# Patient Record
Sex: Male | Born: 1990 | Race: White | Hispanic: No | Marital: Single | Smoking: Current every day smoker
Health system: Southern US, Community
[De-identification: ages and names within clinical notes are randomized; demographics above are authoritative.]

## PROBLEM LIST (undated history)

## (undated) DIAGNOSIS — R569 Unspecified convulsions: Secondary | ICD-10-CM

## (undated) HISTORY — DX: Unspecified convulsions: R56.9

---
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
## 2009-11-16 IMAGING — CR DG CHEST 1V PORT
1 series · 1 of 1 positions shown · non-contrast
Comparison: 05/29/2007

CLINICAL DATA: Status epilepticus

PORTABLE CHEST - 1 VIEW

[view not recorded]
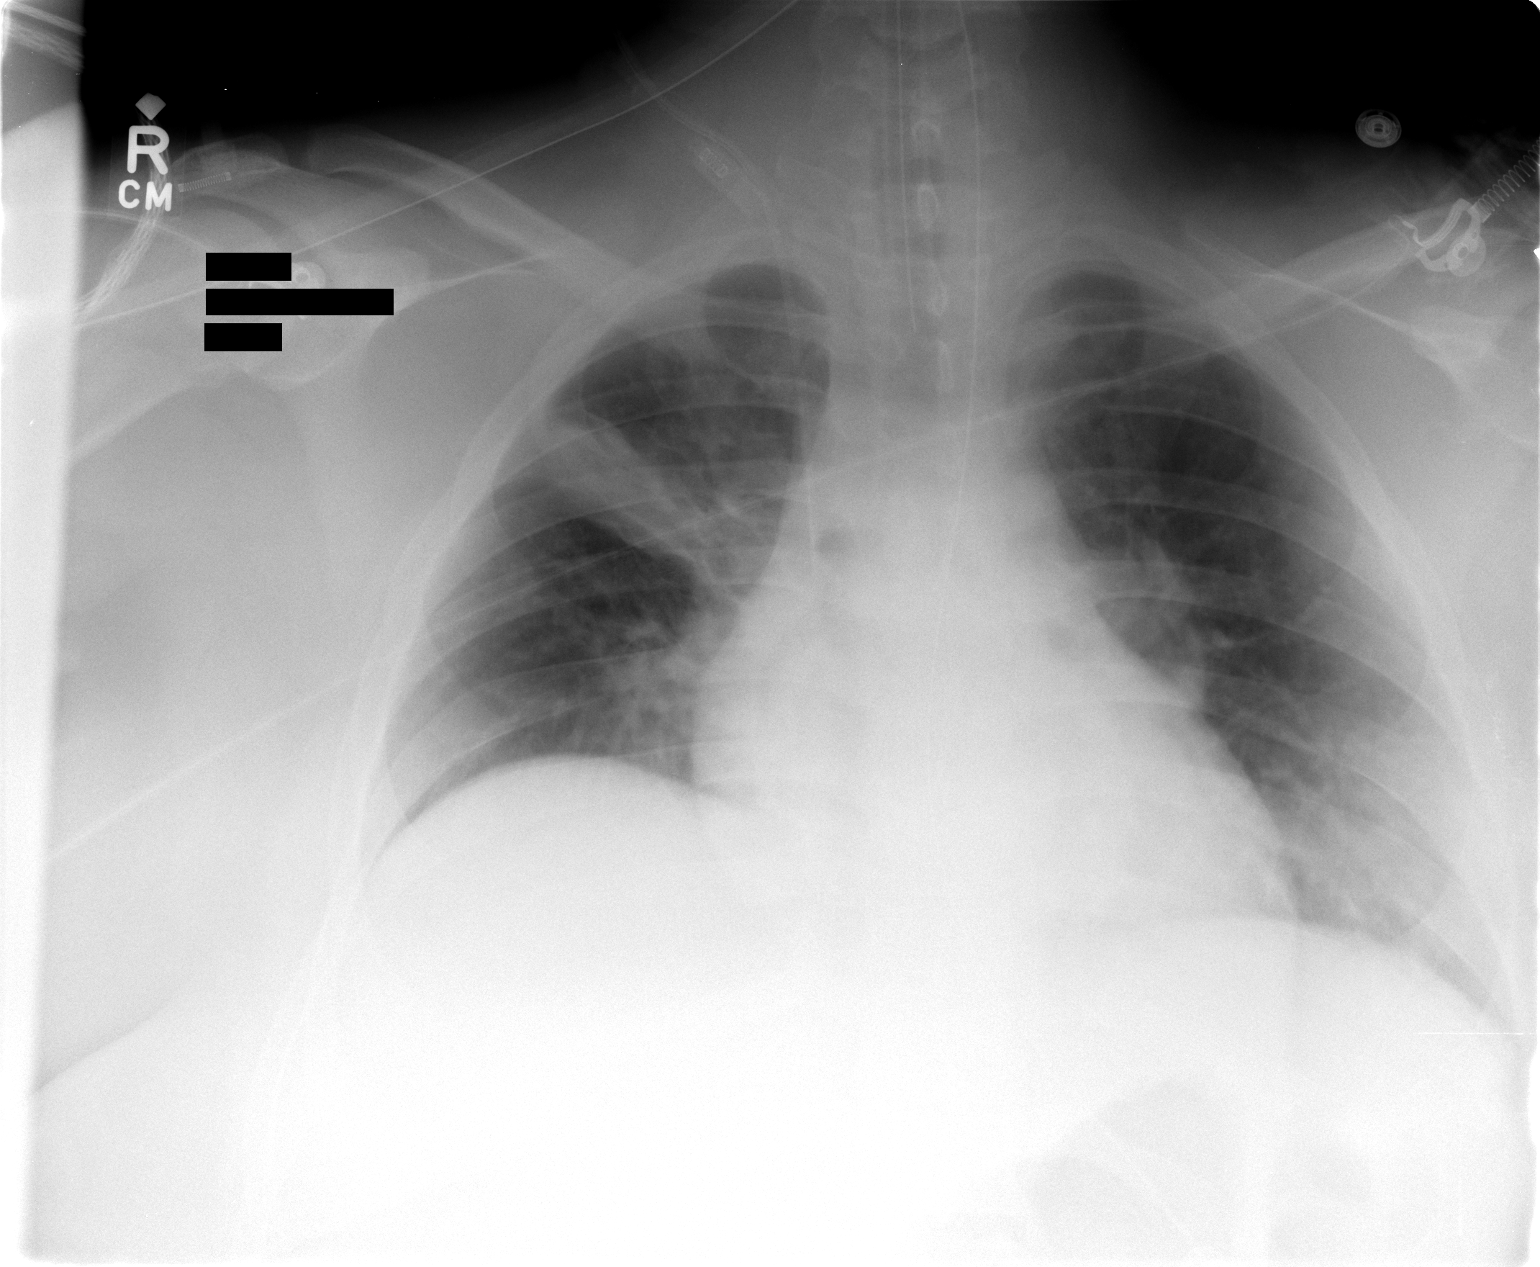

[1 of 1 positions shown; findings below may reference images not displayed]

FINDINGS: Right lower lobe atelectasis improved.  There is
increased right upper lobe atelectasis.  Heart and mediastinal
contours remain normal.  Support apparatus unchanged.
IMPRESSION: 1.  Right lower lobe airspace disease  improved.
2.  Increased right upper lobe atelectasis.

## 2009-11-17 IMAGING — CR DG CHEST 1V PORT
1 series · 1 of 1 positions shown · non-contrast
Comparison: Chest radiograph 05/30/2007

CLINICAL DATA: Status epilepticus

PORTABLE CHEST - 1 VIEW

[AP]
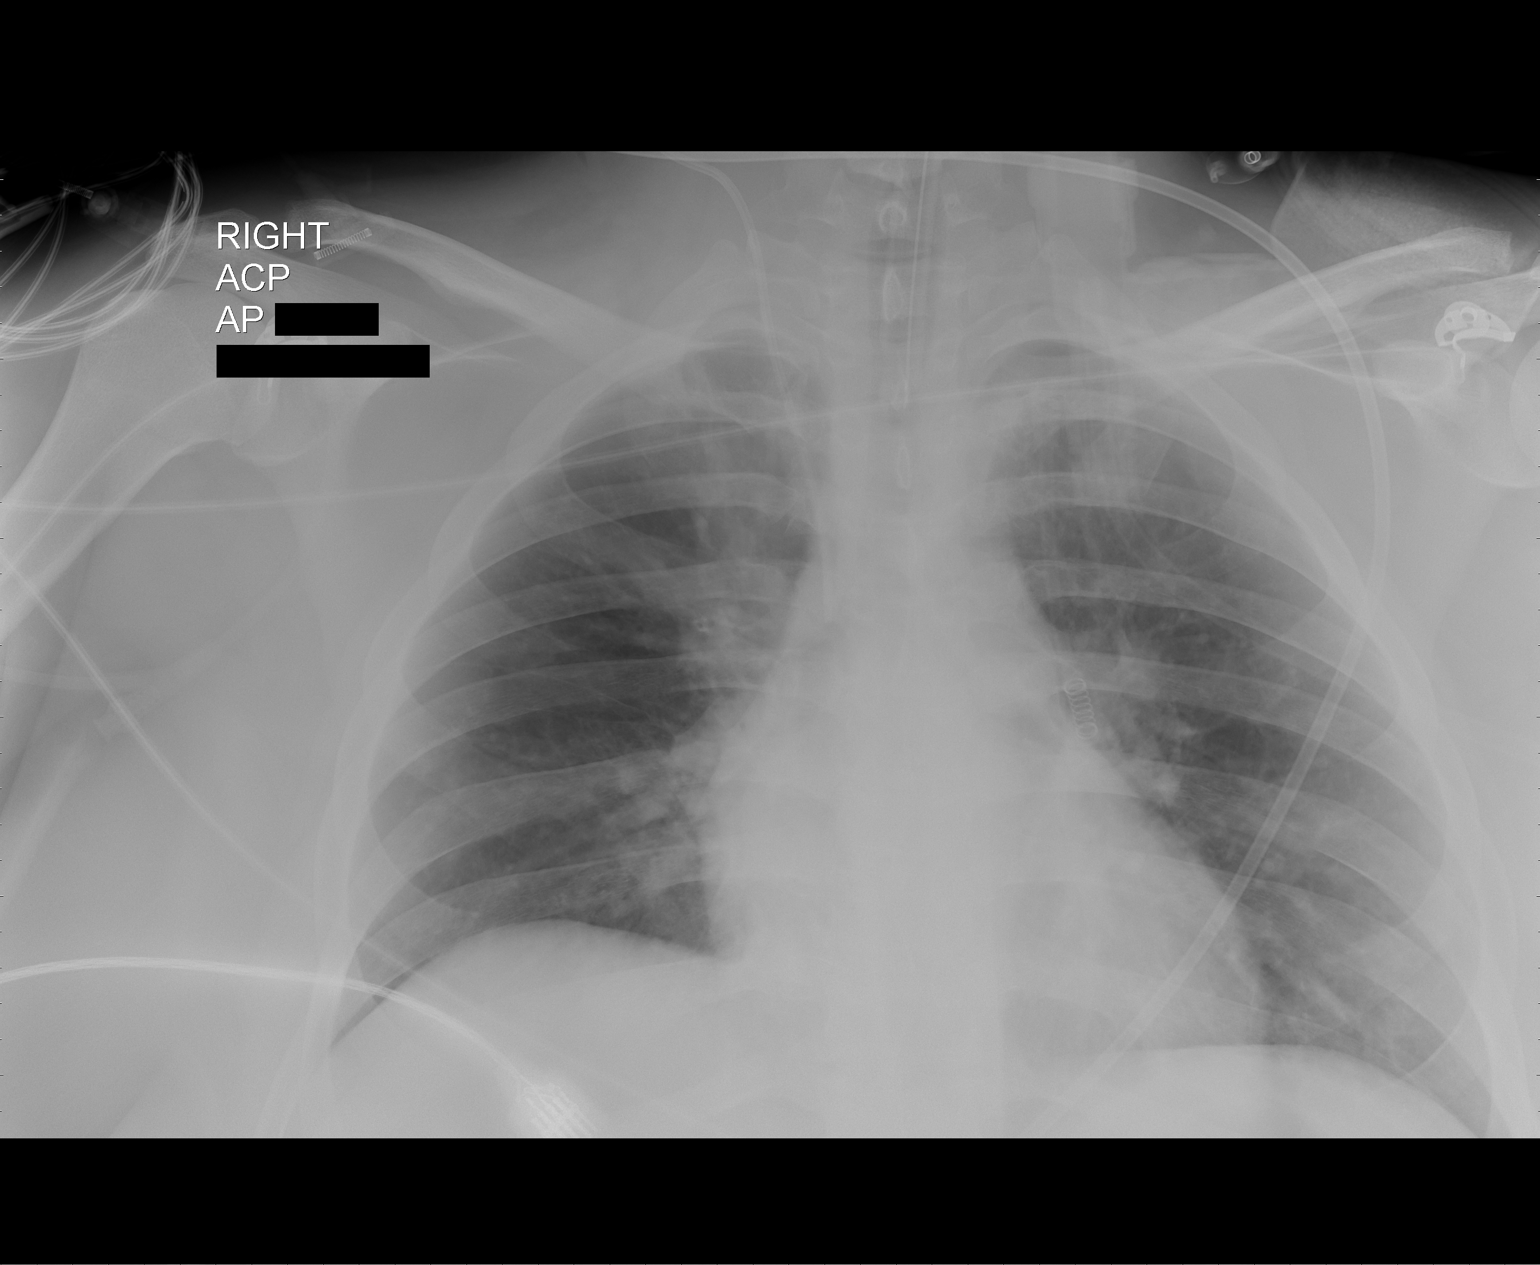

[1 of 1 positions shown; findings below may reference images not displayed]

FINDINGS: Endotracheal tube and right IJ catheter unchanged.
Stable cardiac silhouette.  Costophrenic angles are clear.  There
is a perihilar congestion versus mild edema.  No pneumothorax.
IMPRESSION: 1.  No significant interval change.
2.  Mild venous congestion versus central mild edema.

## 2010-07-05 NOTE — Procedures (Signed)
**Note De-Identified Markus Obfuscation** EEG NUMBER:  [id number]   HISTORY:  This is an 20 year old status post closed head injury with  seizures, who is having an EEG done to evaluate for seizure.   PROCEDURE:  This is a portal EEG.   TECHNICAL DESCRIPTION:  Throughout this portal EEG, there is no distinct  or sustained posterior down rhythm noted.  The background activity is  symmetric, mostly as part of a mixed frequency of alpha, theta, and  delta range activity at 20-50 mV.  There is superimposed beta activity  noticed, which is most likely from medication effect.  There is  occasional EMG movement artifact noticed throughout the background.  The  patient does appear drowsy and appears to fall asleep during this  recording with the appearance of symmetric sleep spindles.  Hyperventilation nor photic stimulation were performed throughout this  record.  Throughout this record, there is no definitive epileptiform  activity noticed.   The EKG tracing shows a heart rate of 100-120 beats per minute.   IMPRESSION:  This electroencephalogram is abnormal secondary to diffuse  slowing.  The diffuse slowing that is suggestive of a toxic, metabolic,  or primary neuronal disorder.  The presence of beta in the background is  highly suggestive of medication effect.  Clinical correlation is  advised.      Bevelyn Buckles. Nash Shearer, M.D.  Electronically Signed     [id number]:[id number]  D:  05/29/2007 17:50:09  T:  05/29/2007 19:36:03  Job #:  [id number]

## 2010-07-05 NOTE — Consult Note (Signed)
**Note De-Identified Burkhammer Obfuscation** NAME:  Speece, Armend                    ACCOUNT NO.:  [id number]   MEDICAL RECORD NO.:  [id number]          PATIENT TYPE:  INP   LOCATION:  2112                         FACILITY:  [facility]   PHYSICIAN:  Deanna Artis. Hickling, M.D.DATE OF BIRTH:  11-May-1990   DATE OF CONSULTATION:  05/24/2007  DATE OF DISCHARGE:                                 CONSULTATION   CHIEF COMPLAINT:  Recurrent seizures following closed head injury.   HISTORY OF PRESENT ILLNESS:  An 20 year old right-handed Caucasian male,  who had dizziness while playing basketball.  He continued to play and  then lost consciousness, falling face first onto the ball and onto a  gravel playing court, with abrasions to his face.  He was unresponsive.  EMS was called.  He is transported to [facility] and  had 8 generalized tonic-clonic seizures en route that were  treated with  Ativan.  He is agitated, not able to respond, moving all fours.  He was  treated with some Versed which calmed him.  He was then sent to CT scan,  returned having vomiting and possibly aspirated.  He felt respiratory  distress and needed intubation to protect his airway.   After intubation, the patient suffered another generalized tonic-clonic  seizure.  He was loaded with 1000 mg of Dilantin intravenously and  placed on Pavulon and Versed, to prevent him from biting through his ET  tube.   PAST MEDICAL HISTORY:  1. Single seizure at age 20 or 87.  2. He has had episodes of syncope and a presumed vasovagal due to      heat, lack of eating, seeing blood or injury.  3. Obesity.   PAST SURGICAL HISTORY:  None.   REVIEW OF SYSTEMS:  A 12-system review is negative.   FAMILY HISTORY:  No seizures, syncope, sudden death, mental retardation,  cerebral palsy, blindness, deafness or autism.   SOCIAL HISTORY:  The patient does not use tobacco, alcohol or drugs.   EXAMINATION:  VITAL SIGNS:  Temperature 98.2, blood pressure 118/53,  resting pulse 97,  respirations 28, oxygen saturation 100% on room air.  HEENT:  Ear, nose and throat:  No infections, no bruits.  LUNGS:  Show rhonchi and coarse airway sounds.  Possibly rales.  HEART:  No murmurs.  Pulses normal.  ABDOMEN:  Soft, protuberant.  Bowel sounds were active.  EXTREMITIES:  Normal.  NEUROLOGIC:  Pupils equal and reactive, 3 mm and 2 mm.  Fundi appeared  normal.  He is near-sighted.  He is on Pavulon.  I am unable to assess  him further.   IMPRESSION:  Closed head injury, rule out shear injury of the brain.  I  see some areas that I think are increased attenuation on the CT scan of  the gray-white matter junction.  These were not called by the  radiologist initially, nor when I pointed it out to them.  He likely has  aspiration pneumonia.  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  It is unclear  whether they will be long-term and not long-term problem, rule out a **Note De-Identified Schlatter Obfuscation** neck injury. (345,3)   PLAN:  1. Dilantin 1000 mg, now 200 mg at 8 hours and every 12 hours      thereafter.  We need to clear his neck for fracture and      subluxation.  2. MRI scan with gradient echo focusing when possible, to look for his      blood.  3. EEG on Monday.  I appreciate the opportunity to participate in his      care.  I have discussed with Dr. Landis Martins, who is the resident on      critical care team.   If you have questions or I can be of assistance, do not hesitate to  contact me.      Deanna Artis. Sharene Skeans, M.D.  Electronically Signed     [facility]/MEDQ  D:  05/24/2007  T:  05/25/2007  Job:  [id number]
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
## 2010-11-15 LAB — POCT I-STAT 3, ART BLOOD GAS (G3+)
Acid-Base Excess: 2
Acid-Base Excess: 2
Acid-Base Excess: 3 — ABNORMAL HIGH
Acid-Base Excess: 5 — ABNORMAL HIGH
Acid-Base Excess: 7 — ABNORMAL HIGH
Acid-base deficit: 2
Acid-base deficit: 4 — ABNORMAL HIGH
Bicarbonate: 23.2
Bicarbonate: 25.7 — ABNORMAL HIGH
Bicarbonate: 28.8 — ABNORMAL HIGH
Bicarbonate: 29.7 — ABNORMAL HIGH
Bicarbonate: 30.3 — ABNORMAL HIGH
Bicarbonate: 30.9 — ABNORMAL HIGH
O2 Saturation: 100
O2 Saturation: 89
O2 Saturation: 90
O2 Saturation: 93
O2 Saturation: 94
O2 Saturation: 96
O2 Saturation: 96
O2 Saturation: 98
O2 Saturation: 98
Operator id: 129411
Operator id: 155841
Operator id: 252031
Operator id: 260421
Operator id: 282221
Patient temperature: 100.5
Patient temperature: 100.9
Patient temperature: 98
Patient temperature: 99.2
TCO2: 23
TCO2: 25
TCO2: 27
TCO2: 27
TCO2: 28
TCO2: 30
TCO2: 31
TCO2: 31
TCO2: 32
pCO2 arterial: 36.6
pCO2 arterial: 38.6
pCO2 arterial: 40.1
pCO2 arterial: 42.1
pCO2 arterial: 42.4
pCO2 arterial: 43.5
pCO2 arterial: 45.4 — ABNORMAL HIGH
pCO2 arterial: 49.2 — ABNORMAL HIGH
pH, Arterial: 7.259 — ABNORMAL LOW
pH, Arterial: 7.364
pH, Arterial: 7.446
pH, Arterial: 7.478 — ABNORMAL HIGH
pO2, Arterial: 116 — ABNORMAL HIGH
pO2, Arterial: 118 — ABNORMAL HIGH
pO2, Arterial: 132 — ABNORMAL HIGH
pO2, Arterial: 146 — ABNORMAL HIGH
pO2, Arterial: 158 — ABNORMAL HIGH
pO2, Arterial: 61 — ABNORMAL LOW
pO2, Arterial: 64 — ABNORMAL LOW
pO2, Arterial: 68 — ABNORMAL LOW
pO2, Arterial: 75 — ABNORMAL LOW
pO2, Arterial: 86
pO2, Arterial: 91

## 2010-11-15 LAB — CBC
HCT: 30.1 — ABNORMAL LOW
HCT: 30.7 — ABNORMAL LOW
HCT: 31.1 — ABNORMAL LOW
HCT: 32 — ABNORMAL LOW
HCT: 36 — ABNORMAL LOW
HCT: 39.3
HCT: 41.9
Hemoglobin: 10 — ABNORMAL LOW
Hemoglobin: 10.6 — ABNORMAL LOW
Hemoglobin: 10.8 — ABNORMAL LOW
Hemoglobin: 11 — ABNORMAL LOW
Hemoglobin: 14.4
MCHC: 34
MCHC: 34.1
MCHC: 34.2
MCHC: 34.4
MCHC: 34.7
MCHC: 34.7
MCHC: 34.8
MCHC: 35.1
MCV: 82
MCV: 82.3
MCV: 82.4
MCV: 83.1
MCV: 83.4
MCV: 83.7
MCV: 83.9
MCV: 84.3
Platelets: 180
Platelets: 186
Platelets: 215
Platelets: 235
Platelets: 246
RBC: 3.67 — ABNORMAL LOW
RBC: 3.69 — ABNORMAL LOW
RBC: 5.09
RBC: 5.35
RDW: 13.1
RDW: 13.1
RDW: 13.1
RDW: 13.1
RDW: 13.2
RDW: 13.3
RDW: 13.4
RDW: 13.7
WBC: 10.2
WBC: 11 — ABNORMAL HIGH

## 2010-11-15 LAB — CARBOXYHEMOGLOBIN
Carboxyhemoglobin: 1.2
Methemoglobin: 0.9
Total hemoglobin: 13.7

## 2010-11-15 LAB — COMPREHENSIVE METABOLIC PANEL
ALT: 18
ALT: 24
Albumin: 2.2 — ABNORMAL LOW
Albumin: 3 — ABNORMAL LOW
Albumin: 3.6
Alkaline Phosphatase: 124 — ABNORMAL HIGH
BUN: 19
Calcium: 8.4
Chloride: 103
Creatinine, Ser: 1.08
Glucose, Bld: 104 — ABNORMAL HIGH
Potassium: 3.6
Potassium: 4.6
Sodium: 132 — ABNORMAL LOW
Sodium: 143
Total Protein: 5.1 — ABNORMAL LOW
Total Protein: 5.4 — ABNORMAL LOW
Total Protein: 6.6

## 2010-11-15 LAB — BASIC METABOLIC PANEL
BUN: 11
BUN: 12
BUN: 14
BUN: 17
CO2: 23
CO2: 26
CO2: 26
CO2: 28
CO2: 28
Calcium: 8.1 — ABNORMAL LOW
Calcium: 8.6
Chloride: 105
Chloride: 106
Chloride: 106
Chloride: 107
Chloride: 107
Chloride: 109
Creatinine, Ser: 0.97
Creatinine, Ser: 1.06
Creatinine, Ser: 1.06
GFR calc Af Amer: >60
GFR calc Af Amer: >60
GFR calc Af Amer: >60
GFR calc Af Amer: >60
GFR calc non Af Amer: >60
GFR calc non Af Amer: >60
GFR calc non Af Amer: >60
GFR calc non Af Amer: >60
Glucose, Bld: 100 — ABNORMAL HIGH
Glucose, Bld: 104 — ABNORMAL HIGH
Glucose, Bld: 105 — ABNORMAL HIGH
Glucose, Bld: 107 — ABNORMAL HIGH
Glucose, Bld: 109 — ABNORMAL HIGH
Glucose, Bld: 120 — ABNORMAL HIGH
Glucose, Bld: 94
Potassium: 3.7
Potassium: 3.8
Potassium: 3.8
Potassium: 4.1
Sodium: 140
Sodium: 144
Sodium: 144

## 2010-11-15 LAB — URINE MICROSCOPIC-ADD ON

## 2010-11-15 LAB — CULTURE, BAL-QUANTITATIVE W GRAM STAIN
Colony Count: NO GROWTH
Colony Count: NO GROWTH
Culture: NO GROWTH

## 2010-11-15 LAB — CULTURE, RESPIRATORY W GRAM STAIN

## 2010-11-15 LAB — PROTIME-INR: INR: 1.2

## 2010-11-15 LAB — CARDIAC PANEL(CRET KIN+CKTOT+MB+TROPI)
Relative Index: 0.6
Troponin I: <0.01

## 2010-11-15 LAB — POCT I-STAT, CHEM 8
Calcium, Ion: 1 — ABNORMAL LOW
Chloride: 108
Glucose, Bld: 145 — ABNORMAL HIGH
HCT: 41
TCO2: 20

## 2010-11-15 LAB — URINALYSIS, ROUTINE W REFLEX MICROSCOPIC
Bilirubin Urine: NEGATIVE
Glucose, UA: NEGATIVE
Ketones, ur: NEGATIVE
Leukocytes, UA: NEGATIVE
Protein, ur: NEGATIVE
pH: 7

## 2010-11-15 LAB — TYPE AND SCREEN
ABO/RH(D): A POS
Antibody Screen: NEGATIVE
Antibody Screen: NEGATIVE

## 2010-11-15 LAB — ALBUMIN: Albumin: 2.4 — ABNORMAL LOW

## 2010-11-15 LAB — RAPID URINE DRUG SCREEN, HOSP PERFORMED
Barbiturates: NOT DETECTED
Benzodiazepines: POSITIVE — AB
Cocaine: NOT DETECTED
Opiates: NOT DETECTED

## 2010-11-15 LAB — TSH: TSH: 4.022

## 2010-11-15 LAB — DIFFERENTIAL
Basophils Absolute: 0
Basophils Relative: 0
Eosinophils Relative: 1
Lymphocytes Relative: 9 — ABNORMAL LOW
Monocytes Absolute: 0.7
Monocytes Absolute: 1.4 — ABNORMAL HIGH
Monocytes Relative: 7
Monocytes Relative: 8
Neutro Abs: 15.1 — ABNORMAL HIGH
Neutro Abs: 6.3

## 2010-11-15 LAB — AMYLASE: Amylase: 32

## 2010-11-15 LAB — MAGNESIUM: Magnesium: 1.9

## 2010-11-15 LAB — CULTURE, BLOOD (ROUTINE X 2): Culture: NO GROWTH

## 2010-11-15 LAB — VANCOMYCIN, TROUGH: Vancomycin Tr: 18.2

## 2010-11-15 LAB — B-NATRIURETIC PEPTIDE (CONVERTED LAB): Pro B Natriuretic peptide (BNP): <30

## 2010-11-15 LAB — PHENYTOIN LEVEL, TOTAL: Phenytoin Lvl: 4.5 — ABNORMAL LOW

## 2010-11-15 LAB — APTT: aPTT: 32

## 2010-11-15 LAB — PHENYTOIN LEVEL, FREE AND TOTAL: Phenytoin, Total: <1.1 (ref 10.0–20.0)

## 2012-10-04 ENCOUNTER — Other Ambulatory Visit: Payer: Self-pay

## 2013-10-22 ENCOUNTER — Ambulatory Visit: Payer: Medicaid Other | Attending: Orthopedic Surgery | Admitting: Occupational Therapy

## 2013-10-22 DIAGNOSIS — IMO0001 Reserved for inherently not codable concepts without codable children: Secondary | ICD-10-CM | POA: Insufficient documentation

## 2013-10-22 DIAGNOSIS — M25649 Stiffness of unspecified hand, not elsewhere classified: Secondary | ICD-10-CM | POA: Insufficient documentation

## 2013-10-22 DIAGNOSIS — M25549 Pain in joints of unspecified hand: Secondary | ICD-10-CM | POA: Insufficient documentation

## 2016-09-27 ENCOUNTER — Ambulatory Visit: Payer: Medicaid Other | Admitting: Podiatry

## 2016-11-01 ENCOUNTER — Encounter: Payer: Self-pay | Admitting: Podiatry

## 2016-11-01 ENCOUNTER — Ambulatory Visit (INDEPENDENT_AMBULATORY_CARE_PROVIDER_SITE_OTHER): Payer: Medicaid Other

## 2016-11-01 ENCOUNTER — Ambulatory Visit (INDEPENDENT_AMBULATORY_CARE_PROVIDER_SITE_OTHER): Payer: Medicaid Other | Admitting: Podiatry

## 2016-11-01 VITALS — BP 133/89 | HR 71 | Resp 16

## 2016-11-01 DIAGNOSIS — L84 Corns and callosities: Secondary | ICD-10-CM | POA: Diagnosis not present

## 2016-11-01 DIAGNOSIS — L989 Disorder of the skin and subcutaneous tissue, unspecified: Secondary | ICD-10-CM

## 2016-11-01 DIAGNOSIS — M216X9 Other acquired deformities of unspecified foot: Secondary | ICD-10-CM | POA: Diagnosis not present

## 2016-11-01 DIAGNOSIS — M216X2 Other acquired deformities of left foot: Secondary | ICD-10-CM | POA: Diagnosis not present

## 2016-11-01 NOTE — Progress Notes (Signed)
**Note De-Identified Maker Obfuscation** Subjective:    Patient ID: Brent Orozco, male   DOB: 26 y.o.   MRN: [id number]   HPI patient presents with caregiver with a painful lesion underneath the left foot that has been increasingly sore with ambulation. States that he has tried to trim it but it does not seem to help    Review of Systems  All other systems reviewed and are negative.       Objective:  Physical Exam  Constitutional: He appears well-developed and well-nourished.  Cardiovascular: Intact distal pulses.   Musculoskeletal: Normal range of motion.  Neurological: He is alert.  Skin: Skin is warm.  Nursing note and vitals reviewed.  neurovascular status was found to be intact muscle strength was adequate with patient noted to have equinus condition bilateral. Patient has significant keratotic lesion sub-third metatarsal left that's painful when pressed with lesion that's deep-seated on the metatarsal head. 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 If symptoms get better we will consider the same treatment option and if they do not work and the need to consider surgical intervention with possibility for elevating osteotomy and possibility for gastroc recession depending on how the lesion forms  X-ray indicates lesion to be directly on the third metatarsal left

## 2016-11-01 NOTE — Progress Notes (Signed)
**Note De-identified [place] Obfuscation**   **Note De-Identified Show Obfuscation** Subjective:    Patient ID: Brent Orozco, male    DOB: 1990/08/15, 26 y.o.   MRN: [id number]  HPI Chief Complaint  Patient presents with  . Callouses    Bilateral; great toes-medial side & plantar forefoot  . Painful lesion    Left foot; plantar forefoot-below 3rd toe; x1 yr      Review of Systems  All other systems reviewed and are negative.      Objective:   Physical Exam        Assessment & Plan:

## 2016-11-06 ENCOUNTER — Ambulatory Visit: Payer: Medicaid Other | Admitting: Podiatry

## 2016-11-27 ENCOUNTER — Ambulatory Visit: Payer: Medicaid Other | Admitting: Podiatry

## 2020-11-28 DIAGNOSIS — H5213 Myopia, bilateral: Secondary | ICD-10-CM | POA: Diagnosis not present

## 2021-08-21 DIAGNOSIS — R569 Unspecified convulsions: Secondary | ICD-10-CM | POA: Diagnosis not present

## 2021-08-21 DIAGNOSIS — R197 Diarrhea, unspecified: Secondary | ICD-10-CM | POA: Diagnosis not present

## 2021-08-21 DIAGNOSIS — R112 Nausea with vomiting, unspecified: Secondary | ICD-10-CM | POA: Diagnosis not present

## 2021-08-21 DIAGNOSIS — K529 Noninfective gastroenteritis and colitis, unspecified: Secondary | ICD-10-CM | POA: Diagnosis not present

## 2021-08-21 DIAGNOSIS — R1084 Generalized abdominal pain: Secondary | ICD-10-CM | POA: Diagnosis not present

## 2022-04-27 DIAGNOSIS — R569 Unspecified convulsions: Secondary | ICD-10-CM | POA: Diagnosis not present

## 2022-06-26 DIAGNOSIS — K0889 Other specified disorders of teeth and supporting structures: Secondary | ICD-10-CM | POA: Diagnosis not present

## 2022-07-25 DIAGNOSIS — M9903 Segmental and somatic dysfunction of lumbar region: Secondary | ICD-10-CM | POA: Diagnosis not present

## 2022-07-25 DIAGNOSIS — M9902 Segmental and somatic dysfunction of thoracic region: Secondary | ICD-10-CM | POA: Diagnosis not present

## 2022-07-25 DIAGNOSIS — M9901 Segmental and somatic dysfunction of cervical region: Secondary | ICD-10-CM | POA: Diagnosis not present

## 2022-07-25 DIAGNOSIS — M62830 Muscle spasm of back: Secondary | ICD-10-CM | POA: Diagnosis not present

## 2022-07-26 DIAGNOSIS — M9903 Segmental and somatic dysfunction of lumbar region: Secondary | ICD-10-CM | POA: Diagnosis not present

## 2022-07-26 DIAGNOSIS — M9902 Segmental and somatic dysfunction of thoracic region: Secondary | ICD-10-CM | POA: Diagnosis not present

## 2022-07-26 DIAGNOSIS — M62830 Muscle spasm of back: Secondary | ICD-10-CM | POA: Diagnosis not present

## 2022-07-26 DIAGNOSIS — M9901 Segmental and somatic dysfunction of cervical region: Secondary | ICD-10-CM | POA: Diagnosis not present

## 2022-07-27 DIAGNOSIS — M9902 Segmental and somatic dysfunction of thoracic region: Secondary | ICD-10-CM | POA: Diagnosis not present

## 2022-07-27 DIAGNOSIS — M9901 Segmental and somatic dysfunction of cervical region: Secondary | ICD-10-CM | POA: Diagnosis not present

## 2022-07-27 DIAGNOSIS — M62830 Muscle spasm of back: Secondary | ICD-10-CM | POA: Diagnosis not present

## 2022-07-27 DIAGNOSIS — M9903 Segmental and somatic dysfunction of lumbar region: Secondary | ICD-10-CM | POA: Diagnosis not present

## 2022-07-31 DIAGNOSIS — M9903 Segmental and somatic dysfunction of lumbar region: Secondary | ICD-10-CM | POA: Diagnosis not present

## 2022-07-31 DIAGNOSIS — M9902 Segmental and somatic dysfunction of thoracic region: Secondary | ICD-10-CM | POA: Diagnosis not present

## 2022-07-31 DIAGNOSIS — M62830 Muscle spasm of back: Secondary | ICD-10-CM | POA: Diagnosis not present

## 2022-07-31 DIAGNOSIS — M9901 Segmental and somatic dysfunction of cervical region: Secondary | ICD-10-CM | POA: Diagnosis not present

## 2022-08-02 DIAGNOSIS — M9902 Segmental and somatic dysfunction of thoracic region: Secondary | ICD-10-CM | POA: Diagnosis not present

## 2022-08-02 DIAGNOSIS — M9901 Segmental and somatic dysfunction of cervical region: Secondary | ICD-10-CM | POA: Diagnosis not present

## 2022-08-02 DIAGNOSIS — M9903 Segmental and somatic dysfunction of lumbar region: Secondary | ICD-10-CM | POA: Diagnosis not present

## 2022-08-02 DIAGNOSIS — M62830 Muscle spasm of back: Secondary | ICD-10-CM | POA: Diagnosis not present

## 2022-08-03 DIAGNOSIS — M9902 Segmental and somatic dysfunction of thoracic region: Secondary | ICD-10-CM | POA: Diagnosis not present

## 2022-08-03 DIAGNOSIS — M9901 Segmental and somatic dysfunction of cervical region: Secondary | ICD-10-CM | POA: Diagnosis not present

## 2022-08-03 DIAGNOSIS — M9903 Segmental and somatic dysfunction of lumbar region: Secondary | ICD-10-CM | POA: Diagnosis not present

## 2022-08-03 DIAGNOSIS — M62830 Muscle spasm of back: Secondary | ICD-10-CM | POA: Diagnosis not present

## 2022-08-07 DIAGNOSIS — M9903 Segmental and somatic dysfunction of lumbar region: Secondary | ICD-10-CM | POA: Diagnosis not present

## 2022-08-07 DIAGNOSIS — M9901 Segmental and somatic dysfunction of cervical region: Secondary | ICD-10-CM | POA: Diagnosis not present

## 2022-08-07 DIAGNOSIS — M62830 Muscle spasm of back: Secondary | ICD-10-CM | POA: Diagnosis not present

## 2022-08-07 DIAGNOSIS — M9902 Segmental and somatic dysfunction of thoracic region: Secondary | ICD-10-CM | POA: Diagnosis not present

## 2022-08-10 DIAGNOSIS — M9902 Segmental and somatic dysfunction of thoracic region: Secondary | ICD-10-CM | POA: Diagnosis not present

## 2022-08-10 DIAGNOSIS — M62830 Muscle spasm of back: Secondary | ICD-10-CM | POA: Diagnosis not present

## 2022-08-10 DIAGNOSIS — M9903 Segmental and somatic dysfunction of lumbar region: Secondary | ICD-10-CM | POA: Diagnosis not present

## 2022-08-10 DIAGNOSIS — M9901 Segmental and somatic dysfunction of cervical region: Secondary | ICD-10-CM | POA: Diagnosis not present

## 2022-08-14 DIAGNOSIS — M62830 Muscle spasm of back: Secondary | ICD-10-CM | POA: Diagnosis not present

## 2022-08-14 DIAGNOSIS — M9902 Segmental and somatic dysfunction of thoracic region: Secondary | ICD-10-CM | POA: Diagnosis not present

## 2022-08-14 DIAGNOSIS — M9903 Segmental and somatic dysfunction of lumbar region: Secondary | ICD-10-CM | POA: Diagnosis not present

## 2022-08-14 DIAGNOSIS — M9901 Segmental and somatic dysfunction of cervical region: Secondary | ICD-10-CM | POA: Diagnosis not present

## 2022-08-16 DIAGNOSIS — M62830 Muscle spasm of back: Secondary | ICD-10-CM | POA: Diagnosis not present

## 2022-08-16 DIAGNOSIS — M9902 Segmental and somatic dysfunction of thoracic region: Secondary | ICD-10-CM | POA: Diagnosis not present

## 2022-08-16 DIAGNOSIS — M9903 Segmental and somatic dysfunction of lumbar region: Secondary | ICD-10-CM | POA: Diagnosis not present

## 2022-08-16 DIAGNOSIS — M9901 Segmental and somatic dysfunction of cervical region: Secondary | ICD-10-CM | POA: Diagnosis not present

## 2022-08-21 DIAGNOSIS — M9902 Segmental and somatic dysfunction of thoracic region: Secondary | ICD-10-CM | POA: Diagnosis not present

## 2022-08-21 DIAGNOSIS — M9901 Segmental and somatic dysfunction of cervical region: Secondary | ICD-10-CM | POA: Diagnosis not present

## 2022-08-21 DIAGNOSIS — M9903 Segmental and somatic dysfunction of lumbar region: Secondary | ICD-10-CM | POA: Diagnosis not present

## 2022-08-21 DIAGNOSIS — M62830 Muscle spasm of back: Secondary | ICD-10-CM | POA: Diagnosis not present

## 2022-08-22 DIAGNOSIS — M9903 Segmental and somatic dysfunction of lumbar region: Secondary | ICD-10-CM | POA: Diagnosis not present

## 2022-08-22 DIAGNOSIS — M9902 Segmental and somatic dysfunction of thoracic region: Secondary | ICD-10-CM | POA: Diagnosis not present

## 2022-08-22 DIAGNOSIS — M62830 Muscle spasm of back: Secondary | ICD-10-CM | POA: Diagnosis not present

## 2022-08-22 DIAGNOSIS — M9901 Segmental and somatic dysfunction of cervical region: Secondary | ICD-10-CM | POA: Diagnosis not present

## 2022-08-28 DIAGNOSIS — M9901 Segmental and somatic dysfunction of cervical region: Secondary | ICD-10-CM | POA: Diagnosis not present

## 2022-08-28 DIAGNOSIS — M9903 Segmental and somatic dysfunction of lumbar region: Secondary | ICD-10-CM | POA: Diagnosis not present

## 2022-08-28 DIAGNOSIS — M62830 Muscle spasm of back: Secondary | ICD-10-CM | POA: Diagnosis not present

## 2022-08-28 DIAGNOSIS — M9902 Segmental and somatic dysfunction of thoracic region: Secondary | ICD-10-CM | POA: Diagnosis not present

## 2022-09-05 DIAGNOSIS — M9902 Segmental and somatic dysfunction of thoracic region: Secondary | ICD-10-CM | POA: Diagnosis not present

## 2022-09-05 DIAGNOSIS — M9901 Segmental and somatic dysfunction of cervical region: Secondary | ICD-10-CM | POA: Diagnosis not present

## 2022-09-05 DIAGNOSIS — M62830 Muscle spasm of back: Secondary | ICD-10-CM | POA: Diagnosis not present

## 2022-09-05 DIAGNOSIS — M9903 Segmental and somatic dysfunction of lumbar region: Secondary | ICD-10-CM | POA: Diagnosis not present

## 2022-12-06 DIAGNOSIS — H5213 Myopia, bilateral: Secondary | ICD-10-CM | POA: Diagnosis not present

## 2023-04-18 DIAGNOSIS — Z833 Family history of diabetes mellitus: Secondary | ICD-10-CM | POA: Diagnosis not present

## 2023-04-18 DIAGNOSIS — Z0000 Encounter for general adult medical examination without abnormal findings: Secondary | ICD-10-CM | POA: Diagnosis not present

## 2023-04-18 DIAGNOSIS — R569 Unspecified convulsions: Secondary | ICD-10-CM | POA: Diagnosis not present

## 2023-04-18 DIAGNOSIS — K219 Gastro-esophageal reflux disease without esophagitis: Secondary | ICD-10-CM | POA: Diagnosis not present

## 2023-04-20 DIAGNOSIS — Z0000 Encounter for general adult medical examination without abnormal findings: Secondary | ICD-10-CM | POA: Diagnosis not present

## 2023-04-20 DIAGNOSIS — Z23 Encounter for immunization: Secondary | ICD-10-CM | POA: Diagnosis not present

## 2023-04-20 DIAGNOSIS — Z13220 Encounter for screening for lipoid disorders: Secondary | ICD-10-CM | POA: Diagnosis not present

## 2023-04-20 DIAGNOSIS — Z833 Family history of diabetes mellitus: Secondary | ICD-10-CM | POA: Diagnosis not present

## 2023-05-10 DIAGNOSIS — M2391 Unspecified internal derangement of right knee: Secondary | ICD-10-CM | POA: Diagnosis not present

## 2023-05-10 DIAGNOSIS — M25561 Pain in right knee: Secondary | ICD-10-CM | POA: Diagnosis not present

## 2023-05-22 ENCOUNTER — Ambulatory Visit: Attending: Orthopedic Surgery

## 2023-05-22 DIAGNOSIS — M25561 Pain in right knee: Secondary | ICD-10-CM | POA: Insufficient documentation

## 2023-05-22 NOTE — Therapy (Signed)
**Note De-Identified [place] Obfuscation** OUTPATIENT PHYSICAL THERAPY LOWER EXTREMITY EVALUATION   Patient Name: Brent Orozco Older MRN: [id number] DOB:1990/02/27, 33 y.o., male Today's Date: 05/22/2023  END OF SESSION:  PT End of Session - 05/22/23 1005     Visit Number 1    Number of Visits 8    Date for PT Re-Evaluation 07/20/23    PT Start Time 1006    PT Stop Time 1040    PT Time Calculation (min) 34 min    Activity Tolerance Patient tolerated treatment well    Behavior During Therapy WFL for tasks assessed/performed             Past Medical History:  Diagnosis Date   Seizures (HCC)    Last one in 2010   History reviewed. No pertinent surgical history. There are no active problems to display for this patient.  REFERRING PROVIDER: Hartford Poli, PA-C  REFERRING DIAG: Internal derangement of right knee  THERAPY DIAG:  Acute pain of right knee  Rationale for Evaluation and Treatment: Rehabilitation  ONSET DATE: 05/08/23  SUBJECTIVE:   SUBJECTIVE STATEMENT: Patient reports that he originally injured his right knee when he "blew out his knee" playing basketball years ago. This eventually got better. However, he was walking the golf [place] on 05/08/23 when he stepping wrong and felt a pop. His knee had popped before, but nothing like this. He has been wearing a brace on his right knee which helps support his knee. He had trouble walking and extended his right knee, but this has gotten better.   PERTINENT HISTORY: Current smoker, history of seizures, and allergies PAIN:  Are you having pain? Yes: NPRS scale: Current: 0/10 Best: 0/10 Worst: 2/10 Pain location: right medial knee Pain description: pressure Aggravating factors: squatting, walking a lot Relieving factors: rest, ice (as needed)  PRECAUTIONS: None  RED FLAGS: None   WEIGHT BEARING RESTRICTIONS: No  FALLS:  Has patient fallen in last 6 months? No  LIVING ENVIRONMENT: Lives with: lives with their family Lives in:  House/apartment Stairs: Yes: External: 2 steps; step to pattern leading with LLE Has following equipment at home: None  OCCUPATION: disabled  PLOF: Independent  PATIENT GOALS: walk longer (2 miles prior to episode, but cannot walk for now), and potentially return to work eventually  NEXT MD VISIT: 06/21/23  OBJECTIVE:  Note: Objective measures were completed at Evaluation unless otherwise noted.  PATIENT SURVEYS:  LEFS 58/80  COGNITION: Overall cognitive status: Within functional limits for tasks assessed     SENSATION: Patient reports no numbness or tingling  EDEMA:  Circumferential: R tibiofemoral joint line: 47.0 cm L tibiofemoral joint line: 45.0 cm  PALPATION: TTP: right MCL and medial hamstrings   LOWER EXTREMITY ROM:  Active ROM Right eval Left eval  Hip flexion    Hip extension    Hip abduction    Hip adduction    Hip internal rotation    Hip external rotation    Knee flexion 106 PROM: 125; "pressure" 125  Knee extension 1 0  Ankle dorsiflexion    Ankle plantarflexion    Ankle inversion    Ankle eversion     (Blank rows = not tested)  LOWER EXTREMITY MMT:  MMT Right eval Left eval  Hip flexion 4/5 4+/5  Hip extension    Hip abduction    Hip adduction    Hip internal rotation    Hip external rotation    Knee flexion 5/5 5/5  Knee extension 4/5 4+/5  Ankle dorsiflexion **Note De-Identified [place] Obfuscation** Ankle plantarflexion    Ankle inversion    Ankle eversion     (Blank rows = not tested)  LOWER EXTREMITY SPECIAL TESTS:  Knee special tests: Anterior drawer test: negative, Posterior drawer test: negative, and Thessaly test: positive   GAIT: Assistive device utilized:  R knee brace Level of assistance: Complete Independence Comments: decreased R knee flexion in swing phase                                                                                                                                 TREATMENT DATE:     PATIENT EDUCATION:  Education details:  exercise progression, objective findings, healing, and goals for physical therapy Person educated: Patient Education method: Explanation Education comprehension: verbalized understanding  HOME EXERCISE PROGRAM:   ASSESSMENT:  CLINICAL IMPRESSION: Patient is a 33 y.o. male who was seen today for physical therapy evaluation and treatment for acute right knee pain. He presented with low pain severity and irritability with palpation to his right MCL and hamstrings reproducing his familiar symptoms. He exhibited mild deficits with right knee flexion active range of motion compared to the left knee. Recommend that he continue with skilled physical therapy to address his impairments to return to his prior level of function.    OBJECTIVE IMPAIRMENTS: Abnormal gait, decreased mobility, decreased ROM, decreased strength, impaired tone, and pain.   ACTIVITY LIMITATIONS: stairs and locomotion level  PARTICIPATION LIMITATIONS: community activity  PERSONAL FACTORS: 3+ comorbidities: Current smoker, history of seizures, and allergies  are also affecting patient's functional outcome.   REHAB POTENTIAL: Good  CLINICAL DECISION MAKING: Stable/uncomplicated  EVALUATION COMPLEXITY: Low   GOALS: Goals reviewed with patient? Yes  LONG TERM GOALS: Target date: 06/19/23  Patient will be independent with his HEP.  Baseline:  Goal status: INITIAL  2.  Patient will improve his LEFS score to at least 68/80 for improved perceived function with his daily activities.  Baseline:  Goal status: INITIAL  3.  Patient will improve his active right knee flexion to at least 120 degrees for improved function navigating steps.  Baseline:  Goal status: INITIAL  4.  Patient will be able to navigate at least 3 steps with a reciprocal pattern for improved household mobility.  Baseline:  Goal status: INITIAL  PLAN:  PT FREQUENCY: 2x/week  PT DURATION: 4 weeks  PLANNED INTERVENTIONS: 97164- PT Re-evaluation,  97110-Therapeutic exercises, 97530- Therapeutic activity, 97112- Neuromuscular re-education, 97535- Self Care, 09811- Manual therapy, [phone]- Gait training, Balance training, Stair training, and Joint mobilization  PLAN FOR NEXT SESSION: Recumbent bike, lower extremity strengthening, provide HEP, and educate on safe exercise progression   Granville Lewis, PT 05/22/2023, 11:58 AM

## 2023-05-28 ENCOUNTER — Ambulatory Visit

## 2023-05-28 DIAGNOSIS — M25561 Pain in right knee: Secondary | ICD-10-CM

## 2023-05-28 NOTE — Therapy (Signed)
**Note De-Identified [place] Obfuscation** OUTPATIENT PHYSICAL THERAPY LOWER EXTREMITY EVALUATION   Patient Name: Brent Orozco MRN: [id number] DOB:March 04, 1990, 33 y.o., male Today's Date: 05/28/2023  END OF SESSION:  PT End of Session - 05/28/23 1644     Visit Number 2    Number of Visits 8    Date for PT Re-Evaluation 07/20/23    PT Start Time 1642    PT Stop Time 1724    PT Time Calculation (min) 42 min    Activity Tolerance Patient tolerated treatment well    Behavior During Therapy [facility] for tasks assessed/performed              Past Medical History:  Diagnosis Date   Seizures (HCC)    Last one in 2010   History reviewed. No pertinent surgical history. There are no active problems to display for this patient.  REFERRING PROVIDER: Hartford Poli, PA-C  REFERRING DIAG: Internal derangement of right knee  THERAPY DIAG:  Acute pain of right knee  Rationale for Evaluation and Treatment: Rehabilitation  ONSET DATE: 05/08/23  SUBJECTIVE:   SUBJECTIVE STATEMENT: Patient reports that he feels like his knee is better than his last appointment. He does not feel the pressure as much as before.   PERTINENT HISTORY: Current smoker, history of seizures, and allergies PAIN:  Are you having pain? Yes: NPRS scale: Current: 0/10 Best: 0/10 Worst: 2/10 Pain location: right medial knee Pain description: pressure Aggravating factors: squatting, walking a lot Relieving factors: rest, ice (as needed)  PRECAUTIONS: None  RED FLAGS: None   WEIGHT BEARING RESTRICTIONS: No  FALLS:  Has patient fallen in last 6 months? No  LIVING ENVIRONMENT: Lives with: lives with their family Lives in: House/apartment Stairs: Yes: External: 2 steps; step to pattern leading with LLE Has following equipment at home: None  OCCUPATION: disabled  PLOF: Independent  PATIENT GOALS: walk longer (2 miles prior to episode, but cannot walk for now), and potentially return to work eventually  NEXT MD VISIT: 06/21/23  OBJECTIVE:   Note: Objective measures were completed at Evaluation unless otherwise noted.  PATIENT SURVEYS:  LEFS 58/80  COGNITION: Overall cognitive status: Within functional limits for tasks assessed     SENSATION: Patient reports no numbness or tingling  EDEMA:  Circumferential: R tibiofemoral joint line: 47.0 cm L tibiofemoral joint line: 45.0 cm  PALPATION: TTP: right MCL and medial hamstrings   LOWER EXTREMITY ROM:  Active ROM Right eval Left eval  Hip flexion    Hip extension    Hip abduction    Hip adduction    Hip internal rotation    Hip external rotation    Knee flexion 106 PROM: 125; "pressure" 125  Knee extension 1 0  Ankle dorsiflexion    Ankle plantarflexion    Ankle inversion    Ankle eversion     (Blank rows = not tested)  LOWER EXTREMITY MMT:  MMT Right eval Left eval  Hip flexion 4/5 4+/5  Hip extension    Hip abduction    Hip adduction    Hip internal rotation    Hip external rotation    Knee flexion 5/5 5/5  Knee extension 4/5 4+/5  Ankle dorsiflexion    Ankle plantarflexion    Ankle inversion    Ankle eversion     (Blank rows = not tested)  LOWER EXTREMITY SPECIAL TESTS:  Knee special tests: Anterior drawer test: negative, Posterior drawer test: negative, and Thessaly test: positive   GAIT: Assistive device utilized:  R **Note De-Identified [place] Obfuscation** knee brace Level of assistance: Complete Independence Comments: decreased R knee flexion in swing phase                                                                                                                                 TREATMENT DATE:                                     05/28/23 EXERCISE LOG  Exercise Repetitions and Resistance Comments  Recumbent bike  L4-6 x 15 minutes    Rocker board  4 minutes   LAQ 5# x 2.5 minutes  Alternating LE  Tandem stance on foam  4 x 30 seconds each  Without UE support   Cybex knee flexion  40# x 3 minutes   Side stepping on foam  10 laps With slight knee flexion; without  UE support   Standing HS stretch 4 x 30 seconds  RLE only    Blank cell = exercise not performed today   PATIENT EDUCATION:  Education details: healing and expectation for soreness Person educated: Patient Education method: Explanation Education comprehension: verbalized understanding  HOME EXERCISE PROGRAM:   ASSESSMENT:  CLINICAL IMPRESSION: Patient was introduced to multiple new interventions for improved knee mobility and stability on unstable terrain. He required minimal cueing with today's new interventions for proper exercise performance. He experienced no significant increase in pain or discomfort with any of today's interventions. He reported feeling "about the same" upon the conclusion of treatment. He continues to require skilled physical therapy to address his remaining impairments to return to his prior level of function.    OBJECTIVE IMPAIRMENTS: Abnormal gait, decreased mobility, decreased ROM, decreased strength, impaired tone, and pain.   ACTIVITY LIMITATIONS: stairs and locomotion level  PARTICIPATION LIMITATIONS: community activity  PERSONAL FACTORS: 3+ comorbidities: Current smoker, history of seizures, and allergies  are also affecting patient's functional outcome.   REHAB POTENTIAL: Good  CLINICAL DECISION MAKING: Stable/uncomplicated  EVALUATION COMPLEXITY: Low   GOALS: Goals reviewed with patient? Yes  LONG TERM GOALS: Target date: 06/19/23  Patient will be independent with his HEP.  Baseline:  Goal status: INITIAL  2.  Patient will improve his LEFS score to at least 68/80 for improved perceived function with his daily activities.  Baseline:  Goal status: INITIAL  3.  Patient will improve his active right knee flexion to at least 120 degrees for improved function navigating steps.  Baseline:  Goal status: INITIAL  4.  Patient will be able to navigate at least 3 steps with a reciprocal pattern for improved household mobility.  Baseline:  Goal  status: INITIAL  PLAN:  PT FREQUENCY: 2x/week  PT DURATION: 4 weeks  PLANNED INTERVENTIONS: 97164- PT Re-evaluation, 97110-Therapeutic exercises, 97530- Therapeutic activity, 97112- Neuromuscular re-education, 97535- Self Care, 13086- Manual therapy, [phone]- Gait training, Balance training, Stair training, and Joint mobilization **Note De-Identified [place] Obfuscation** PLAN FOR NEXT SESSION: Recumbent bike, lower extremity strengthening, provide HEP, and educate on safe exercise progression   Granville Lewis, PT 05/28/2023, 6:22 PM

## 2023-05-30 ENCOUNTER — Ambulatory Visit: Admitting: Physical Therapy

## 2023-05-30 ENCOUNTER — Encounter: Payer: Self-pay | Admitting: Physical Therapy

## 2023-05-30 DIAGNOSIS — M25561 Pain in right knee: Secondary | ICD-10-CM

## 2023-05-30 NOTE — Therapy (Signed)
**Note De-Identified [place] Obfuscation** OUTPATIENT PHYSICAL THERAPY LOWER EXTREMITY EVALUATION   Patient Name: Brent Orozco MRN: [id number] DOB:Jul 08, 1990, 33 y.o., male Today's Date: 05/30/2023  END OF SESSION:  PT End of Session - 05/30/23 1450     Visit Number 3    Number of Visits 8    Date for PT Re-Evaluation 07/20/23    PT Start Time 0230    PT Stop Time 0314    PT Time Calculation (min) 44 min    Activity Tolerance Patient tolerated treatment well    Behavior During Therapy [facility] for tasks assessed/performed               Past Medical History:  Diagnosis Date   Seizures (HCC)    Last one in 2010   History reviewed. No pertinent surgical history. There are no active problems to display for this patient.  REFERRING PROVIDER: Hartford Poli, PA-C  REFERRING DIAG: Internal derangement of right knee  THERAPY DIAG:  Acute pain of right knee  Rationale for Evaluation and Treatment: Rehabilitation  ONSET DATE: 05/08/23  SUBJECTIVE:   SUBJECTIVE STATEMENT: No pain today.  PERTINENT HISTORY: Current smoker, history of seizures, and allergies PAIN:  Are you having pain? Yes: NPRS scale: Current: 0/10 Best: 0/10 Worst: 2/10 Pain location: right medial knee Pain description: pressure Aggravating factors: squatting, walking a lot Relieving factors: rest, ice (as needed)  PRECAUTIONS: None  RED FLAGS: None   WEIGHT BEARING RESTRICTIONS: No  FALLS:  Has patient fallen in last 6 months? No  LIVING ENVIRONMENT: Lives with: lives with their family Lives in: House/apartment Stairs: Yes: External: 2 steps; step to pattern leading with LLE Has following equipment at home: None  OCCUPATION: disabled  PLOF: Independent  PATIENT GOALS: walk longer (2 miles prior to episode, but cannot walk for now), and potentially return to work eventually  NEXT MD VISIT: 06/21/23  OBJECTIVE:  Note: Objective measures were completed at Evaluation unless otherwise noted.  PATIENT SURVEYS:  LEFS  58/80  COGNITION: Overall cognitive status: Within functional limits for tasks assessed     SENSATION: Patient reports no numbness or tingling  EDEMA:  Circumferential: R tibiofemoral joint line: 47.0 cm L tibiofemoral joint line: 45.0 cm  PALPATION: TTP: right MCL and medial hamstrings   LOWER EXTREMITY ROM:  Active ROM Right eval Left eval  Hip flexion    Hip extension    Hip abduction    Hip adduction    Hip internal rotation    Hip external rotation    Knee flexion 106 PROM: 125; "pressure" 125  Knee extension 1 0  Ankle dorsiflexion    Ankle plantarflexion    Ankle inversion    Ankle eversion     (Blank rows = not tested)  LOWER EXTREMITY MMT:  MMT Right eval Left eval  Hip flexion 4/5 4+/5  Hip extension    Hip abduction    Hip adduction    Hip internal rotation    Hip external rotation    Knee flexion 5/5 5/5  Knee extension 4/5 4+/5  Ankle dorsiflexion    Ankle plantarflexion    Ankle inversion    Ankle eversion     (Blank rows = not tested)  LOWER EXTREMITY SPECIAL TESTS:  Knee special tests: Anterior drawer test: negative, Posterior drawer test: negative, and Thessaly test: positive   GAIT: Assistive device utilized:  R knee brace Level of assistance: Complete Independence Comments: decreased R knee flexion in swing phase **Note De-Identified [place] Obfuscation** TREATMENT DATE:    05/30/23:                                     EXERCISE LOG  Exercise Repetitions and Resistance Comments  Recumbent Level 5 x 15 minutes   Knee ext 10# x 3 minutes   Ham curls 40# x 3 minutes   Leg press 2 plates x 3 minutes   Heel lifts on leg press platform  2 minutes    Rockerboard  5 minutes    4 inch heel dots In parallel bars 2 sets to fatigue   Airex balance pad  In parallel bars with ball throws x 4 minutes.                                      05/28/23  EXERCISE LOG  Exercise Repetitions and Resistance Comments  Recumbent bike  L4-6 x 15 minutes    Rocker board  4 minutes   LAQ 5# x 2.5 minutes  Alternating LE  Tandem stance on foam  4 x 30 seconds each  Without UE support   Cybex knee flexion  40# x 3 minutes   Side stepping on foam  10 laps With slight knee flexion; without UE support   Standing HS stretch 4 x 30 seconds  RLE only    Blank cell = exercise not performed today   PATIENT EDUCATION:  Education details: healing and expectation for soreness Person educated: Patient Education method: Explanation Education comprehension: verbalized understanding  HOME EXERCISE PROGRAM:   ASSESSMENT:  CLINICAL IMPRESSION: Patient is highly motivated and did very well with new interventions today performing with excellent technique and no pain.    OBJECTIVE IMPAIRMENTS: Abnormal gait, decreased mobility, decreased ROM, decreased strength, impaired tone, and pain.   ACTIVITY LIMITATIONS: stairs and locomotion level  PARTICIPATION LIMITATIONS: community activity  PERSONAL FACTORS: 3+ comorbidities: Current smoker, history of seizures, and allergies  are also affecting patient's functional outcome.   REHAB POTENTIAL: Good  CLINICAL DECISION MAKING: Stable/uncomplicated  EVALUATION COMPLEXITY: Low   GOALS: Goals reviewed with patient? Yes  LONG TERM GOALS: Target date: 06/19/23  Patient will be independent with his HEP.  Baseline:  Goal status: INITIAL  2.  Patient will improve his LEFS score to at least 68/80 for improved perceived function with his daily activities.  Baseline:  Goal status: INITIAL  3.  Patient will improve his active right knee flexion to at least 120 degrees for improved function navigating steps.  Baseline:  Goal status: INITIAL  4.  Patient will be able to navigate at least 3 steps with a reciprocal pattern for improved household mobility.  Baseline:  Goal status: INITIAL  PLAN:  PT  FREQUENCY: 2x/week  PT DURATION: 4 weeks  PLANNED INTERVENTIONS: 97164- PT Re-evaluation, 97110-Therapeutic exercises, 97530- Therapeutic activity, 97112- Neuromuscular re-education, 97535- Self Care, 11914- Manual therapy, [phone]- Gait training, Balance training, Stair training, and Joint mobilization  PLAN FOR NEXT SESSION: Recumbent bike, lower extremity strengthening, provide HEP, and educate on safe exercise progression   Bodhi Stenglein, [place], PT 05/30/2023, 3:25 PM

## 2023-06-21 DIAGNOSIS — M2391 Unspecified internal derangement of right knee: Secondary | ICD-10-CM | POA: Diagnosis not present

## 2023-07-23 DIAGNOSIS — R569 Unspecified convulsions: Secondary | ICD-10-CM | POA: Diagnosis not present
